# Patient Record
Sex: Male | Born: 1945 | Race: White | Hispanic: No | Marital: Married | Smoking: Former smoker
Health system: Southern US, Community
[De-identification: ages and names within clinical notes are randomized; demographics above are authoritative.]

---

## 2016-04-28 ENCOUNTER — Encounter (HOSPITAL_BASED_OUTPATIENT_CLINIC_OR_DEPARTMENT_OTHER): Payer: Medicare Other | Attending: Internal Medicine

## 2016-04-28 ENCOUNTER — Other Ambulatory Visit: Payer: Self-pay | Admitting: Internal Medicine

## 2016-04-28 ENCOUNTER — Ambulatory Visit (HOSPITAL_COMMUNITY)
Admission: RE | Admit: 2016-04-28 | Discharge: 2016-04-28 | Disposition: A | Discharge status: Home / Self Care | Payer: Medicare Other | Source: Ambulatory Visit | Attending: Internal Medicine | Admitting: Internal Medicine

## 2016-04-28 DIAGNOSIS — J449 Chronic obstructive pulmonary disease, unspecified: Secondary | ICD-10-CM | POA: Insufficient documentation

## 2016-04-28 DIAGNOSIS — M272 Inflammatory conditions of jaws: Secondary | ICD-10-CM | POA: Insufficient documentation

## 2016-04-28 DIAGNOSIS — Z9221 Personal history of antineoplastic chemotherapy: Secondary | ICD-10-CM | POA: Diagnosis not present

## 2016-04-28 DIAGNOSIS — I700 Atherosclerosis of aorta: Secondary | ICD-10-CM | POA: Diagnosis not present

## 2016-04-28 DIAGNOSIS — Z85828 Personal history of other malignant neoplasm of skin: Secondary | ICD-10-CM | POA: Insufficient documentation

## 2016-04-28 DIAGNOSIS — Z9889 Other specified postprocedural states: Secondary | ICD-10-CM | POA: Diagnosis not present

## 2016-04-28 DIAGNOSIS — Z9289 Personal history of other medical treatment: Secondary | ICD-10-CM

## 2016-04-28 DIAGNOSIS — Z87891 Personal history of nicotine dependence: Secondary | ICD-10-CM | POA: Insufficient documentation

## 2016-04-28 DIAGNOSIS — R0602 Shortness of breath: Secondary | ICD-10-CM | POA: Diagnosis present

## 2016-04-28 DIAGNOSIS — L598 Other specified disorders of the skin and subcutaneous tissue related to radiation: Secondary | ICD-10-CM | POA: Insufficient documentation

## 2016-04-28 DIAGNOSIS — Z923 Personal history of irradiation: Secondary | ICD-10-CM | POA: Insufficient documentation

## 2016-05-05 DIAGNOSIS — M272 Inflammatory conditions of jaws: Secondary | ICD-10-CM | POA: Diagnosis not present

## 2016-05-08 DIAGNOSIS — M272 Inflammatory conditions of jaws: Secondary | ICD-10-CM | POA: Diagnosis not present

## 2016-05-09 DIAGNOSIS — M272 Inflammatory conditions of jaws: Secondary | ICD-10-CM | POA: Diagnosis not present

## 2016-05-10 DIAGNOSIS — M272 Inflammatory conditions of jaws: Secondary | ICD-10-CM | POA: Diagnosis not present

## 2016-05-11 DIAGNOSIS — M272 Inflammatory conditions of jaws: Secondary | ICD-10-CM | POA: Diagnosis not present

## 2016-05-12 DIAGNOSIS — M272 Inflammatory conditions of jaws: Secondary | ICD-10-CM | POA: Diagnosis not present

## 2016-05-16 DIAGNOSIS — M272 Inflammatory conditions of jaws: Secondary | ICD-10-CM | POA: Diagnosis not present

## 2016-05-17 ENCOUNTER — Ambulatory Visit (INDEPENDENT_AMBULATORY_CARE_PROVIDER_SITE_OTHER): Payer: Medicare Other | Admitting: Internal Medicine

## 2016-05-17 ENCOUNTER — Encounter: Payer: Self-pay | Admitting: Internal Medicine

## 2016-05-17 VITALS — BP 166/76 | HR 64 | Temp 97.7°F | Ht 70.0 in | Wt 184.0 lb

## 2016-05-17 DIAGNOSIS — M272 Inflammatory conditions of jaws: Secondary | ICD-10-CM

## 2016-05-17 LAB — BASIC METABOLIC PANEL
BUN: 18 mg/dL (ref 7–25)
CHLORIDE: 102 mmol/L (ref 98–110)
CO2: 27 mmol/L (ref 20–31)
Calcium: 9.7 mg/dL (ref 8.6–10.3)
Creat: 1 mg/dL (ref 0.70–1.25)
Glucose, Bld: 192 mg/dL — ABNORMAL HIGH (ref 65–99)
POTASSIUM: 4 mmol/L (ref 3.5–5.3)
Sodium: 140 mmol/L (ref 135–146)

## 2016-05-17 LAB — CBC WITH DIFFERENTIAL/PLATELET
BASOS ABS: 0 {cells}/uL (ref 0–200)
BASOS PCT: 0 %
EOS ABS: 390 {cells}/uL (ref 15–500)
Eosinophils Relative: 6 %
HEMATOCRIT: 47.8 % (ref 38.5–50.0)
HEMOGLOBIN: 16.5 g/dL (ref 13.2–17.1)
Lymphocytes Relative: 16 %
Lymphs Abs: 1040 cells/uL (ref 850–3900)
MCH: 32.9 pg (ref 27.0–33.0)
MCHC: 34.5 g/dL (ref 32.0–36.0)
MCV: 95.2 fL (ref 80.0–100.0)
MONO ABS: 455 {cells}/uL (ref 200–950)
MONOS PCT: 7 %
MPV: 10.2 fL (ref 7.5–12.5)
NEUTROS ABS: 4615 {cells}/uL (ref 1500–7800)
Neutrophils Relative %: 71 %
Platelets: 169 10*3/uL (ref 140–400)
RBC: 5.02 MIL/uL (ref 4.20–5.80)
RDW: 13 % (ref 11.0–15.0)
WBC: 6.5 10*3/uL (ref 3.8–10.8)

## 2016-05-17 NOTE — Patient Instructions (Signed)
Please call us after you have procedure to check that we follow up on your culture results

## 2016-05-17 NOTE — Progress Notes (Signed)
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 The patient describes his symptoms as starting in April of 2017 as a dull 3/10 pain to his right jaw. He was evaluated by his dentist who attempted tooth extraction, however during RCT it was noted that there was a small area of exposed bone distal of the tooth #31, concern for osteomyelitis. The patient has intermittent swelling of the right mandible. The patient has undergone multiple rounds of antibiotics most recently including Augmentin (875/125) for the past 30 days. Each time the patient reports that his swelling and pain has improved with antibiotics but recurs once he is off of antibiotics. 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 He is planned to have further bone biopsy and possible dental extraction to affected teeth with the plan to start back on abtx as well as undergoing HBO in helps to improve his healing of jaw.   Outpatient Encounter Prescriptions as of 05/17/2016  Medication Sig  . albuterol (PROVENTIL HFA;VENTOLIN HFA) 108 (90 Base) MCG/ACT inhaler Inhale into the lungs.  Marland Kitchen. amoxicillin (AMOXIL) 875 MG tablet   . atorvastatin (LIPITOR) 20 MG tablet TAKE ONE TABLET (20 MG TOTAL) BY MOUTH DAILY.  . celecoxib (CELEBREX) 200 MG capsule **GREENSTONE BRAND**TAKE 1 CAPSULE BY MOUTH DAILY  . cetirizine (ZYRTEC) 10 MG tablet Take by mouth.  . fluticasone (FLONASE) 50 MCG/ACT nasal spray USE 2 SPRAYS IN EACH NOSTRIL EVERY DAY AS NEEDED.  Marland Kitchen. levothyroxine (SYNTHROID, LEVOTHROID) 75 MCG tablet TAKE 1 TABLET BY MOUTH EVERY DAY  . Multiple Vitamins-Minerals (MULTIVITAMIN WITH MINERALS) tablet Take by mouth.  Marland Kitchen. omeprazole (PRILOSEC) 20 MG capsule TAKE 1 CAPSULE BY  MOUTH DAILY *MAX 30 DAY ON INS*  . pilocarpine (SALAGEN) 5 MG tablet TAKE ONE TABLET (5 MG TOTAL) BY MOUTH DAILY.  . [DISCONTINUED] fluticasone furoate-vilanterol (BREO ELLIPTA) 100-25 MCG/INH AEPB Inhale into the lungs.   No facility-administered encounter medications on file as of 05/17/2016.      There are no active problems to display for this patient.    Health Maintenance Due  Topic Date Due  . Hepatitis C Screening  01949/03/09  . TETANUS/TDAP  08/12/1966  . COLONOSCOPY  08/11/1997  . ZOSTAVAX  08/12/2007  . PNA vac Low Risk Adult (1 of 2 - PCV13) 08/11/2012  . INFLUENZA VACCINE  12/28/2015    Social History  Substance Use Topics  . Smoking status: Former Smoker    Types: Cigarettes    Quit date: 05/29/2001  . Smokeless tobacco: Never Used  . Alcohol use 7.2 oz/week    8 Cans of beer, 4 Shots of liquor per week     Comment: beer and whiskey    Family hx: cancer+  Review of Systems + dry mouth side effect of radiation. Otherwise 10 point ros is negative Physical Exam   BP (!) 166/76   Pulse 64   Temp 97.7 F (36.5 C) (Oral)   Ht 5\' 10"  (1.778 m)   Wt 184 lb (83.5 kg)   BMI 26.40 kg/m   Physical Exam  Constitutional: He is oriented to person, place, and time. He appears well-developed and well-nourished. No distress.  HENT:  Mouth/Throat: Oropharynx is clear and moist.  No oropharyngeal exudate.  Cardiovascular: Normal rate, regular rhythm and normal heart sounds. Exam reveals no gallop and no friction rub.  No murmur heard.  Pulmonary/Chest: Effort normal and breath sounds normal. No respiratory distress. He has no wheezes.  Abdominal: Soft. Bowel sounds are normal. He exhibits no distension. There is no tenderness.  Lymphadenopathy:  He has no cervical adenopathy.  Neurological: He is alert and oriented to person, place, and time.  Skin: Skin is warm and dry. No rash noted. No erythema.  Psychiatric: He has a normal mood and affect. His behavior is normal.     Lab Results  Component Value Date   ESRSEDRATE 4 06/27/2016   Lab Results  Component Value Date   CRP 1.0 06/27/2016   CBC    Component Value Date/Time   WBC 6.5 05/17/2016 1654   RBC 5.02 05/17/2016 1654   HGB 16.5 05/17/2016 1654   HCT 47.8 05/17/2016 1654   PLT 169 05/17/2016 1654   MCV 95.2 05/17/2016 1654   MCH 32.9 05/17/2016 1654   MCHC 34.5 05/17/2016 1654   RDW 13.0 05/17/2016 1654   LYMPHSABS 1,040 05/17/2016 1654   MONOABS 455 05/17/2016 1654   EOSABS 390 05/17/2016 1654   BASOSABS 0 05/17/2016 1654   BMET    Component Value Date/Time   NA 140 05/17/2016 1654   K 4.0 05/17/2016 1654   CL 102 05/17/2016 1654   CO2 27 05/17/2016 1654   GLUCOSE 192 (H) 05/17/2016 1654   BUN 18 05/17/2016 1654   CREATININE 1.00 05/17/2016 1654   CALCIUM 9.7 05/17/2016 1654     Assessment and Plan  Jaw osteo = Currently on amoxicillin which he will continue until 1 wk prior to his oral surgery. Bone cx to be taken at that time  We will plan for prolonged abtx based upon cx results. He may need IV pending results  Will get labs today as baseline

## 2016-05-18 DIAGNOSIS — M272 Inflammatory conditions of jaws: Secondary | ICD-10-CM | POA: Diagnosis not present

## 2016-05-18 LAB — SEDIMENTATION RATE: Sed Rate: 1 mm/hr (ref 0–20)

## 2016-05-18 LAB — C-REACTIVE PROTEIN: CRP: 1.5 mg/L (ref <8.0)

## 2016-05-19 DIAGNOSIS — M272 Inflammatory conditions of jaws: Secondary | ICD-10-CM | POA: Diagnosis not present

## 2016-05-23 DIAGNOSIS — M272 Inflammatory conditions of jaws: Secondary | ICD-10-CM | POA: Diagnosis not present

## 2016-05-24 DIAGNOSIS — M272 Inflammatory conditions of jaws: Secondary | ICD-10-CM | POA: Diagnosis not present

## 2016-05-25 DIAGNOSIS — M272 Inflammatory conditions of jaws: Secondary | ICD-10-CM | POA: Diagnosis not present

## 2016-05-26 DIAGNOSIS — M272 Inflammatory conditions of jaws: Secondary | ICD-10-CM | POA: Diagnosis not present

## 2016-05-30 ENCOUNTER — Encounter (HOSPITAL_BASED_OUTPATIENT_CLINIC_OR_DEPARTMENT_OTHER): Payer: Medicare Other | Attending: Surgery

## 2016-05-30 DIAGNOSIS — M272 Inflammatory conditions of jaws: Secondary | ICD-10-CM | POA: Diagnosis present

## 2016-05-31 DIAGNOSIS — M272 Inflammatory conditions of jaws: Secondary | ICD-10-CM | POA: Diagnosis not present

## 2016-06-01 DIAGNOSIS — M272 Inflammatory conditions of jaws: Secondary | ICD-10-CM | POA: Diagnosis not present

## 2016-06-02 DIAGNOSIS — M272 Inflammatory conditions of jaws: Secondary | ICD-10-CM | POA: Diagnosis not present

## 2016-06-05 DIAGNOSIS — M272 Inflammatory conditions of jaws: Secondary | ICD-10-CM | POA: Diagnosis not present

## 2016-06-06 DIAGNOSIS — M272 Inflammatory conditions of jaws: Secondary | ICD-10-CM | POA: Diagnosis not present

## 2016-06-07 DIAGNOSIS — M272 Inflammatory conditions of jaws: Secondary | ICD-10-CM | POA: Diagnosis not present

## 2016-06-12 DIAGNOSIS — M272 Inflammatory conditions of jaws: Secondary | ICD-10-CM | POA: Diagnosis not present

## 2016-06-13 DIAGNOSIS — M272 Inflammatory conditions of jaws: Secondary | ICD-10-CM | POA: Diagnosis not present

## 2016-06-19 DIAGNOSIS — M272 Inflammatory conditions of jaws: Secondary | ICD-10-CM | POA: Diagnosis not present

## 2016-06-20 DIAGNOSIS — M272 Inflammatory conditions of jaws: Secondary | ICD-10-CM | POA: Diagnosis not present

## 2016-06-21 DIAGNOSIS — M272 Inflammatory conditions of jaws: Secondary | ICD-10-CM | POA: Diagnosis not present

## 2016-06-22 DIAGNOSIS — M272 Inflammatory conditions of jaws: Secondary | ICD-10-CM | POA: Diagnosis not present

## 2016-06-23 DIAGNOSIS — M272 Inflammatory conditions of jaws: Secondary | ICD-10-CM | POA: Diagnosis not present

## 2016-06-26 DIAGNOSIS — M272 Inflammatory conditions of jaws: Secondary | ICD-10-CM | POA: Diagnosis not present

## 2016-06-27 ENCOUNTER — Ambulatory Visit (INDEPENDENT_AMBULATORY_CARE_PROVIDER_SITE_OTHER): Payer: Medicare Other | Admitting: Internal Medicine

## 2016-06-27 VITALS — BP 128/77 | HR 71 | Temp 98.6°F | Wt 183.0 lb

## 2016-06-27 DIAGNOSIS — M272 Inflammatory conditions of jaws: Secondary | ICD-10-CM

## 2016-06-27 MED ORDER — AMOXICILLIN-POT CLAVULANATE 875-125 MG PO TABS: 1.0000 | ORAL_TABLET | Freq: Two times a day (BID) | ORAL | 1 refills | Status: DC

## 2016-06-27 NOTE — Progress Notes (Signed)
Patient ID: Nathaniel Olson, male   DOB: [birth year], 71 y.o.   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  Inflammatory markers were normal at last visit on 12/20. on augmentin. Briefly, he has  a history of radiation therapy to the neck with cisplatin chemotherapy X 2. The patient describes his symptoms as starting in April of 2017 as a dull 3/10 pain. He was evaluated by his dentist who attempted RCT on tooth #31, however during RCT it was noted that there was a small area of exposed bone distal of the tooth #31. The patient has intermittent swelling of the right mandible. The patient has undergone multiple rounds of antibiotics most recently including Augmentin (875/125) for the past 30 days. Each time the patient reports that his swelling and pain has improved with antibiotics. He is now undergoing HBO in helps to improve his healing of jaw.   On 1/11 had bone biopsy and debridement with cultures growing Mixed flora gram positives/ anaerobes 4+. He was restarted on amox/clav after surgery.  Osteoradionecrosis.   Gross Description A: "Bone and soft tissue of right mandible" and consists of a 1.2 x 0.7 x 0.3 cm fragment of soft pink-tan tissue in A1.Also received is a 1.8 x 0.5 x 0.3 cm tan cortical bone fragment submitted in A2, NTR following decalcification.  B: "Teeth" and it consists of a 3.6 x 2.3 x 0.6 cm aggregate of yellow-gray fragmented teeth and a 1.0 x 0.6 x 0.3 cm aggregate of soft red-brown tissue.Within the aggregate of teeth are two silver metal crowns up to 1.0 cm in greatest dimension.The soft tissue is submitted in B1.The teeth is retained in formalin.    Outpatient Encounter Prescriptions as of 06/27/2016  Medication Sig  . albuterol (PROVENTIL HFA;VENTOLIN HFA) 108 (90 Base) MCG/ACT inhaler Inhale into the lungs.  Marland Kitchen atorvastatin (LIPITOR) 20 MG tablet TAKE ONE TABLET (20 MG TOTAL) BY MOUTH DAILY.  .  celecoxib (CELEBREX) 200 MG capsule **GREENSTONE BRAND**TAKE 1 CAPSULE BY MOUTH DAILY  . cetirizine (ZYRTEC) 10 MG tablet Take by mouth.  . fluticasone (FLONASE) 50 MCG/ACT nasal spray USE 2 SPRAYS IN EACH NOSTRIL EVERY DAY AS NEEDED.  Marland Kitchen levothyroxine (SYNTHROID, LEVOTHROID) 75 MCG tablet TAKE 1 TABLET BY MOUTH EVERY DAY  . Multiple Vitamins-Minerals (MULTIVITAMIN WITH MINERALS) tablet Take by mouth.  Marland Kitchen omeprazole (PRILOSEC) 20 MG capsule TAKE 1 CAPSULE BY MOUTH DAILY *MAX 30 DAY ON INS*  . pilocarpine (SALAGEN) 5 MG tablet TAKE ONE TABLET (5 MG TOTAL) BY MOUTH DAILY.  . [DISCONTINUED] amoxicillin (AMOXIL) 875 MG tablet    No facility-administered encounter medications on file as of 06/27/2016.      There are no active problems to display for this patient.    Health Maintenance Due  Topic Date Due  . Hepatitis C Screening  09/14/1945  . TETANUS/TDAP  08/12/1966  . COLONOSCOPY  08/11/1997  . ZOSTAVAX  08/12/2007  . PNA vac Low Risk Adult (1 of 2 - PCV13) 08/11/2012     Review of Systems  Physical Exam   BP 128/77   Pulse 71   Temp 98.6 F (37 C) (Oral)   Wt 183 lb (83 kg)   BMI 26.26 kg/m   Physical Exam  Constitutional: He is oriented to person, place, and time. He appears well-developed and well-nourished. No distress.  HENT: has sutures to upper and bottow jaw of affected area, still some associated  tissue swelling Mouth/Throat: Oropharynx is clear and moist. No oropharyngeal exudate.  Cardiovascular: Normal rate, regular rhythm and normal heart sounds. Exam reveals no gallop and no friction rub.  No murmur heard.  Pulmonary/Chest: Effort normal and breath sounds normal. No respiratory distress. He has no wheezes.  Lymphadenopathy:  He has no cervical adenopathy.    CBC Lab Results  Component Value Date   WBC 6.5 05/17/2016   RBC 5.02 05/17/2016   HGB 16.5 05/17/2016   HCT 47.8 05/17/2016   PLT 169 05/17/2016   MCV 95.2 05/17/2016   MCH 32.9 05/17/2016   MCHC  34.5 05/17/2016   RDW 13.0 05/17/2016   LYMPHSABS 1,040 05/17/2016   MONOABS 455 05/17/2016   EOSABS 390 05/17/2016    BMET Lab Results  Component Value Date   NA 140 05/17/2016   K 4.0 05/17/2016   CL 102 05/17/2016   CO2 27 05/17/2016   GLUCOSE 192 (H) 05/17/2016   BUN 18 05/17/2016   CREATININE 1.00 05/17/2016   CALCIUM 9.7 05/17/2016   Lab Results  Component Value Date   ESRSEDRATE 4 06/27/2016   Lab Results  Component Value Date   CRP 1.0 06/27/2016      Assessment and Plan  Jaw osteomyelitis/osteonecrosis = continue with amox/clav plan to treat for 4 wk to complete 6 wk. Will check his labs- sed rate and crp  Addendum - still within normal. Do not feel he needs Iv therapy or much more prolonged abtx

## 2016-06-28 DIAGNOSIS — M272 Inflammatory conditions of jaws: Secondary | ICD-10-CM | POA: Diagnosis not present

## 2016-06-28 LAB — SEDIMENTATION RATE: Sed Rate: 4 mm/hr (ref 0–20)

## 2016-06-28 LAB — C-REACTIVE PROTEIN: CRP: 1 mg/L (ref <8.0)

## 2016-06-29 ENCOUNTER — Encounter (HOSPITAL_BASED_OUTPATIENT_CLINIC_OR_DEPARTMENT_OTHER): Payer: Medicare Other | Attending: Internal Medicine

## 2016-06-29 DIAGNOSIS — M272 Inflammatory conditions of jaws: Secondary | ICD-10-CM | POA: Insufficient documentation

## 2016-06-29 DIAGNOSIS — L598 Other specified disorders of the skin and subcutaneous tissue related to radiation: Secondary | ICD-10-CM | POA: Diagnosis not present

## 2016-06-30 DIAGNOSIS — M272 Inflammatory conditions of jaws: Secondary | ICD-10-CM | POA: Diagnosis not present

## 2016-07-03 DIAGNOSIS — M272 Inflammatory conditions of jaws: Secondary | ICD-10-CM | POA: Diagnosis not present

## 2016-07-04 DIAGNOSIS — M272 Inflammatory conditions of jaws: Secondary | ICD-10-CM | POA: Diagnosis not present

## 2016-07-05 DIAGNOSIS — M272 Inflammatory conditions of jaws: Secondary | ICD-10-CM | POA: Diagnosis not present

## 2016-07-06 DIAGNOSIS — M272 Inflammatory conditions of jaws: Secondary | ICD-10-CM | POA: Diagnosis not present

## 2016-07-07 DIAGNOSIS — M272 Inflammatory conditions of jaws: Secondary | ICD-10-CM | POA: Diagnosis not present

## 2016-07-10 DIAGNOSIS — M272 Inflammatory conditions of jaws: Secondary | ICD-10-CM | POA: Diagnosis not present

## 2016-07-11 DIAGNOSIS — M272 Inflammatory conditions of jaws: Secondary | ICD-10-CM | POA: Diagnosis not present

## 2016-07-12 ENCOUNTER — Telehealth: Payer: Self-pay | Admitting: *Deleted

## 2016-07-12 NOTE — Telephone Encounter (Signed)
Patient called c/o a dime size knot on his jaw line that started yesterday and he feels is progressively worsening. It is painful to the touch. He is still on the amoxicillin. Advised patient will speak to Dr. Drue SecondSnider in the AM and give him a call back in the morning.

## 2016-07-13 ENCOUNTER — Other Ambulatory Visit: Payer: Medicare Other

## 2016-07-13 ENCOUNTER — Other Ambulatory Visit: Payer: Self-pay | Admitting: Internal Medicine

## 2016-07-13 DIAGNOSIS — R6884 Jaw pain: Secondary | ICD-10-CM

## 2016-07-13 MED ORDER — CLINDAMYCIN HCL 300 MG PO CAPS: 300.0000 mg | ORAL_CAPSULE | Freq: Three times a day (TID) | ORAL | 0 refills | Status: DC

## 2016-07-13 NOTE — Telephone Encounter (Signed)
Dr. Drue SecondSnider spoke to patient and he was scheduled to come in for lab work this afternoon.

## 2016-07-13 NOTE — Progress Notes (Signed)
Having jaw pain while on amox/clav. Will add clindamycin for one week. Check sed rate and crp

## 2016-07-13 NOTE — Telephone Encounter (Signed)
I spoke to patient and gave course of clindamycin and labs

## 2016-07-14 LAB — C-REACTIVE PROTEIN: CRP: 1.4 mg/L (ref <8.0)

## 2016-07-14 LAB — SEDIMENTATION RATE: SED RATE: 1 mm/h (ref 0–20)

## 2016-07-18 ENCOUNTER — Telehealth: Payer: Self-pay | Admitting: *Deleted

## 2016-07-18 NOTE — Telephone Encounter (Signed)
Patient called and advised he has recent labs and never got the results. He also advised that he is on oral antibiotics that have no refills and he will be out by Friday and wants to know if he should continue on the medication. Advised him will contact the doctor and give him a call once she answers.

## 2016-07-19 ENCOUNTER — Other Ambulatory Visit: Payer: Self-pay | Admitting: Internal Medicine

## 2016-07-19 MED ORDER — CLINDAMYCIN HCL 300 MG PO CAPS: 300.0000 mg | ORAL_CAPSULE | Freq: Three times a day (TID) | ORAL | 0 refills | Status: AC

## 2016-07-19 NOTE — Progress Notes (Signed)
Patient reports having improvement in symptoms after starting clindamycin. He states less jaw pain and swelling. We will give refill for 14 days. He is to see oral surgeons on 3/8 for evaluation

## 2016-07-19 NOTE — Telephone Encounter (Signed)
I have spoken to patient. Will refill clindamycin but d/c augmentin. He is aware of instructions.

## 2016-08-01 ENCOUNTER — Encounter: Payer: Self-pay | Admitting: Internal Medicine

## 2016-08-01 ENCOUNTER — Ambulatory Visit (INDEPENDENT_AMBULATORY_CARE_PROVIDER_SITE_OTHER): Payer: Medicare Other | Admitting: Internal Medicine

## 2016-08-01 VITALS — BP 145/82 | HR 63 | Temp 98.1°F | Ht 70.0 in | Wt 180.0 lb

## 2016-08-01 DIAGNOSIS — M272 Inflammatory conditions of jaws: Secondary | ICD-10-CM | POA: Diagnosis present

## 2016-08-01 NOTE — Progress Notes (Signed)
Patient ID: Nathaniel Olson, male   DOB: May 31, 1945, 71 y.o.   MRN: [id number]  HPI 71yo M who had radiation induced jaw necrosis who developed right sided jaw pain thought to be due to abscessed tooth with concern for osteomyelitis. He previously was on prolonged oral abtx, at beginning of the year, he had debridement and tooth extraction, where he was restarted on amox/clav. Roughly 3-4 wk ago he started to have right sided jaw pain, he was switched empirically to clindamycin x 2 courses ( 20 d total). He Feels better from clindamycin but has residual numbness at corner of mouth. He denied swelling of area but more c/w nerve pain.  Outpatient Encounter Prescriptions as of 08/01/2016  Medication Sig  . albuterol (PROVENTIL HFA;VENTOLIN HFA) 108 (90 Base) MCG/ACT inhaler Inhale into the lungs.  Marland Kitchen. atorvastatin (LIPITOR) 20 MG tablet TAKE ONE TABLET (20 MG TOTAL) BY MOUTH DAILY.  . celecoxib (CELEBREX) 200 MG capsule **GREENSTONE BRAND**TAKE 1 CAPSULE BY MOUTH DAILY  . cetirizine (ZYRTEC) 10 MG tablet Take by mouth.  . chlorhexidine (PERIDEX) 0.12 % solution   . clindamycin (CLEOCIN) 300 MG capsule Take 1 capsule (300 mg total) by mouth 3 (three) times daily.  . fluticasone (FLONASE) 50 MCG/ACT nasal spray USE 2 SPRAYS IN EACH NOSTRIL EVERY DAY AS NEEDED.  Marland Kitchen. levothyroxine (SYNTHROID, LEVOTHROID) 75 MCG tablet TAKE 1 TABLET BY MOUTH EVERY DAY  . Multiple Vitamins-Minerals (MULTIVITAMIN WITH MINERALS) tablet Take by mouth.  Marland Kitchen. omeprazole (PRILOSEC) 20 MG capsule TAKE 1 CAPSULE BY MOUTH DAILY *MAX 30 DAY ON INS*  . pilocarpine (SALAGEN) 5 MG tablet TAKE ONE TABLET (5 MG TOTAL) BY MOUTH DAILY.  . Stannous Fluoride (GEL-KAM) 0.4 % GEL daily.   Marland Kitchen. amoxicillin-clavulanate (AUGMENTIN) 875-125 MG tablet Take 1 tablet by mouth 2 (two) times daily. (Patient not taking: Reported on 08/01/2016)   No facility-administered encounter medications on file as of 08/01/2016.      There are no active problems  to display for this patient.    Health Maintenance Due  Topic Date Due  . Hepatitis C Screening  0January 02, 1947  . TETANUS/TDAP  08/12/1966  . COLONOSCOPY  08/11/1997  . PNA vac Low Risk Adult (1 of 2 - PCV13) 08/11/2012     Review of Systems Review of Systems  Constitutional: Negative for fever, chills, diaphoresis, activity change, appetite change, fatigue and unexpected weight change.  HENT: Negative for congestion, sore throat, rhinorrhea, sneezing, trouble swallowing and sinus pressure.  Eyes: Negative for photophobia and visual disturbance.  Respiratory: Negative for cough, chest tightness, shortness of breath, wheezing and stridor.  Cardiovascular: Negative for chest pain, palpitations and leg swelling.  Gastrointestinal: Negative for nausea, vomiting, abdominal pain, diarrhea, constipation, blood in stool, abdominal distention and anal bleeding.  Genitourinary: Negative for dysuria, hematuria, flank pain and difficulty urinating.  Musculoskeletal: Negative for myalgias, back pain, joint swelling, arthralgias and gait problem.  Skin: Negative for color change, pallor, rash and wound.  Neurological: Negative for dizziness, tremors, weakness and light-headedness.  Hematological: Negative for adenopathy. Does not bruise/bleed easily.  Psychiatric/Behavioral: Negative for behavioral problems, confusion, sleep disturbance, dysphoric mood, decreased concentration and agitation.    Physical Exam   BP (!) 145/82   Pulse 63   Temp 98.1 F (36.7 C) (Oral)   Ht 5\' 10"  (1.778 m)   Wt 180 lb (81.6 kg)   BMI 25.83 kg/m   Physical Exam  Constitutional: He is oriented to person, place, and time. He appears well-developed  and well-nourished. No distress.  HENT: tooth extraction areas still slow to heal Mouth/Throat: Oropharynx is clear and moist. No oropharyngeal exudate.  Cardiovascular: Normal rate, regular rhythm and normal heart sounds. Exam reveals no gallop and no friction rub.  No  murmur heard.  Pulmonary/Chest: Effort normal and breath sounds normal. No respiratory distress. He has no wheezes.  Lymphadenopathy:  He has no cervical adenopathy.  Skin: Skin is warm and dry. No rash noted. No erythema.  Psychiatric: He has a normal mood and affect. His behavior is normal.    CBC Lab Results  Component Value Date   WBC 6.5 05/17/2016   RBC 5.02 05/17/2016   HGB 16.5 05/17/2016   HCT 47.8 05/17/2016   PLT 169 05/17/2016   MCV 95.2 05/17/2016   MCH 32.9 05/17/2016   MCHC 34.5 05/17/2016   RDW 13.0 05/17/2016   LYMPHSABS 1,040 05/17/2016   MONOABS 455 05/17/2016   EOSABS 390 05/17/2016    BMET Lab Results  Component Value Date   NA 140 05/17/2016   K 4.0 05/17/2016   CL 102 05/17/2016   CO2 27 05/17/2016   GLUCOSE 192 (H) 05/17/2016   BUN 18 05/17/2016   CREATININE 1.00 05/17/2016   CALCIUM 9.7 05/17/2016    Lab Results  Component Value Date   ESRSEDRATE 1 07/13/2016   Lab Results  Component Value Date   CRP 1.4 07/13/2016     Assessment and Plan  Jaw osteo = flare appears improved with 2 course of clindamycin. Will plan on having him switch back to amox/clav to see if it continues to have improved symptoms

## 2016-08-30 ENCOUNTER — Other Ambulatory Visit: Payer: Self-pay | Admitting: Internal Medicine

## 2016-08-30 MED ORDER — AMOXICILLIN-POT CLAVULANATE 875-125 MG PO TABS: 1.0000 | ORAL_TABLET | Freq: Two times a day (BID) | ORAL | 1 refills | Status: AC

## 2016-08-30 NOTE — Progress Notes (Signed)
Patient called to state he ran out of amox/clav. Will refill since still has open wound from tooth extraction

## 2018-01-22 IMAGING — CR DG CHEST 2V
2 series · 2 of 2 positions shown · non-contrast
Comparison: None.

CLINICAL DATA: History of squamous cell carcinoma of throat.
Shortness of breath.

EXAM:
CHEST  2 VIEW

[w chest pa]
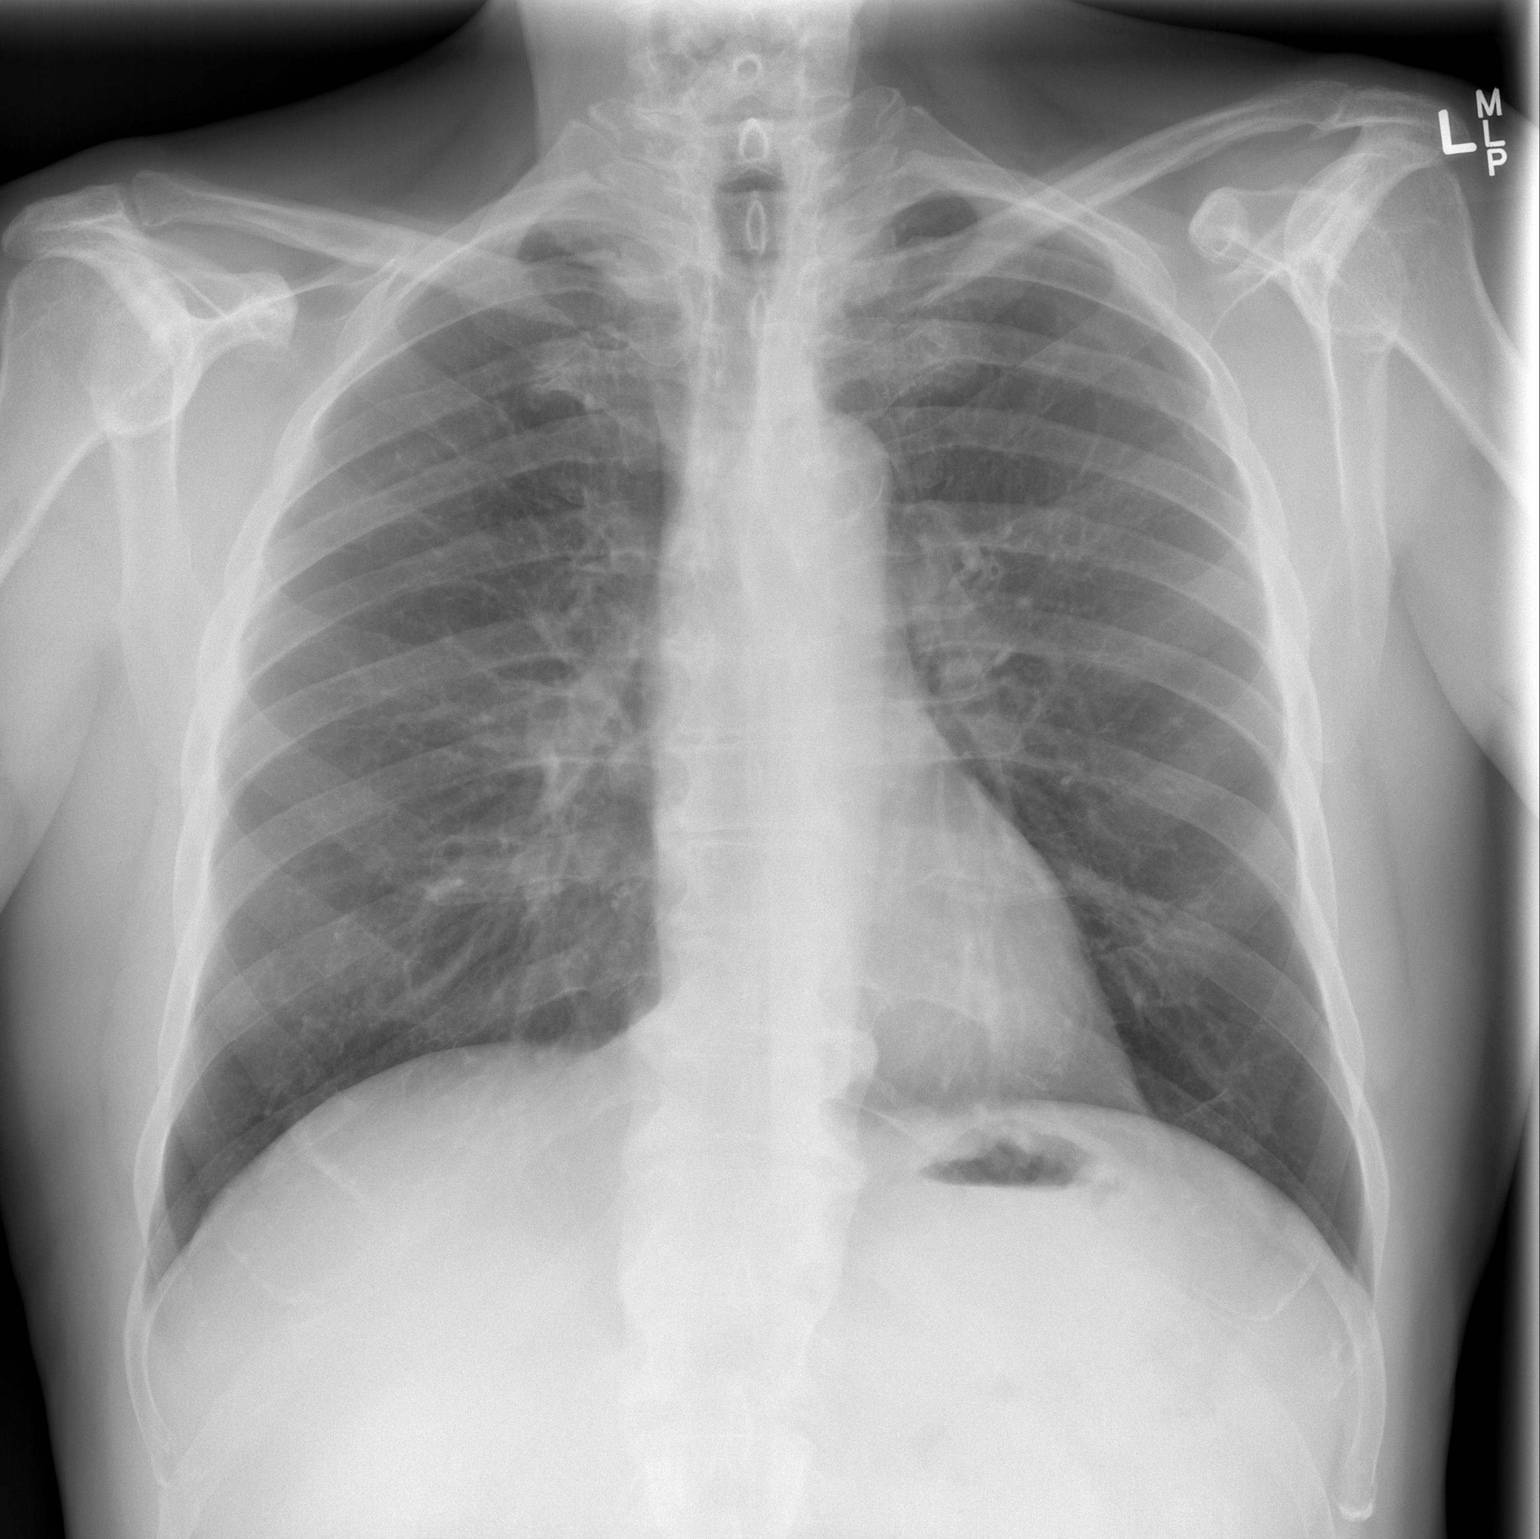

[w chest lat]
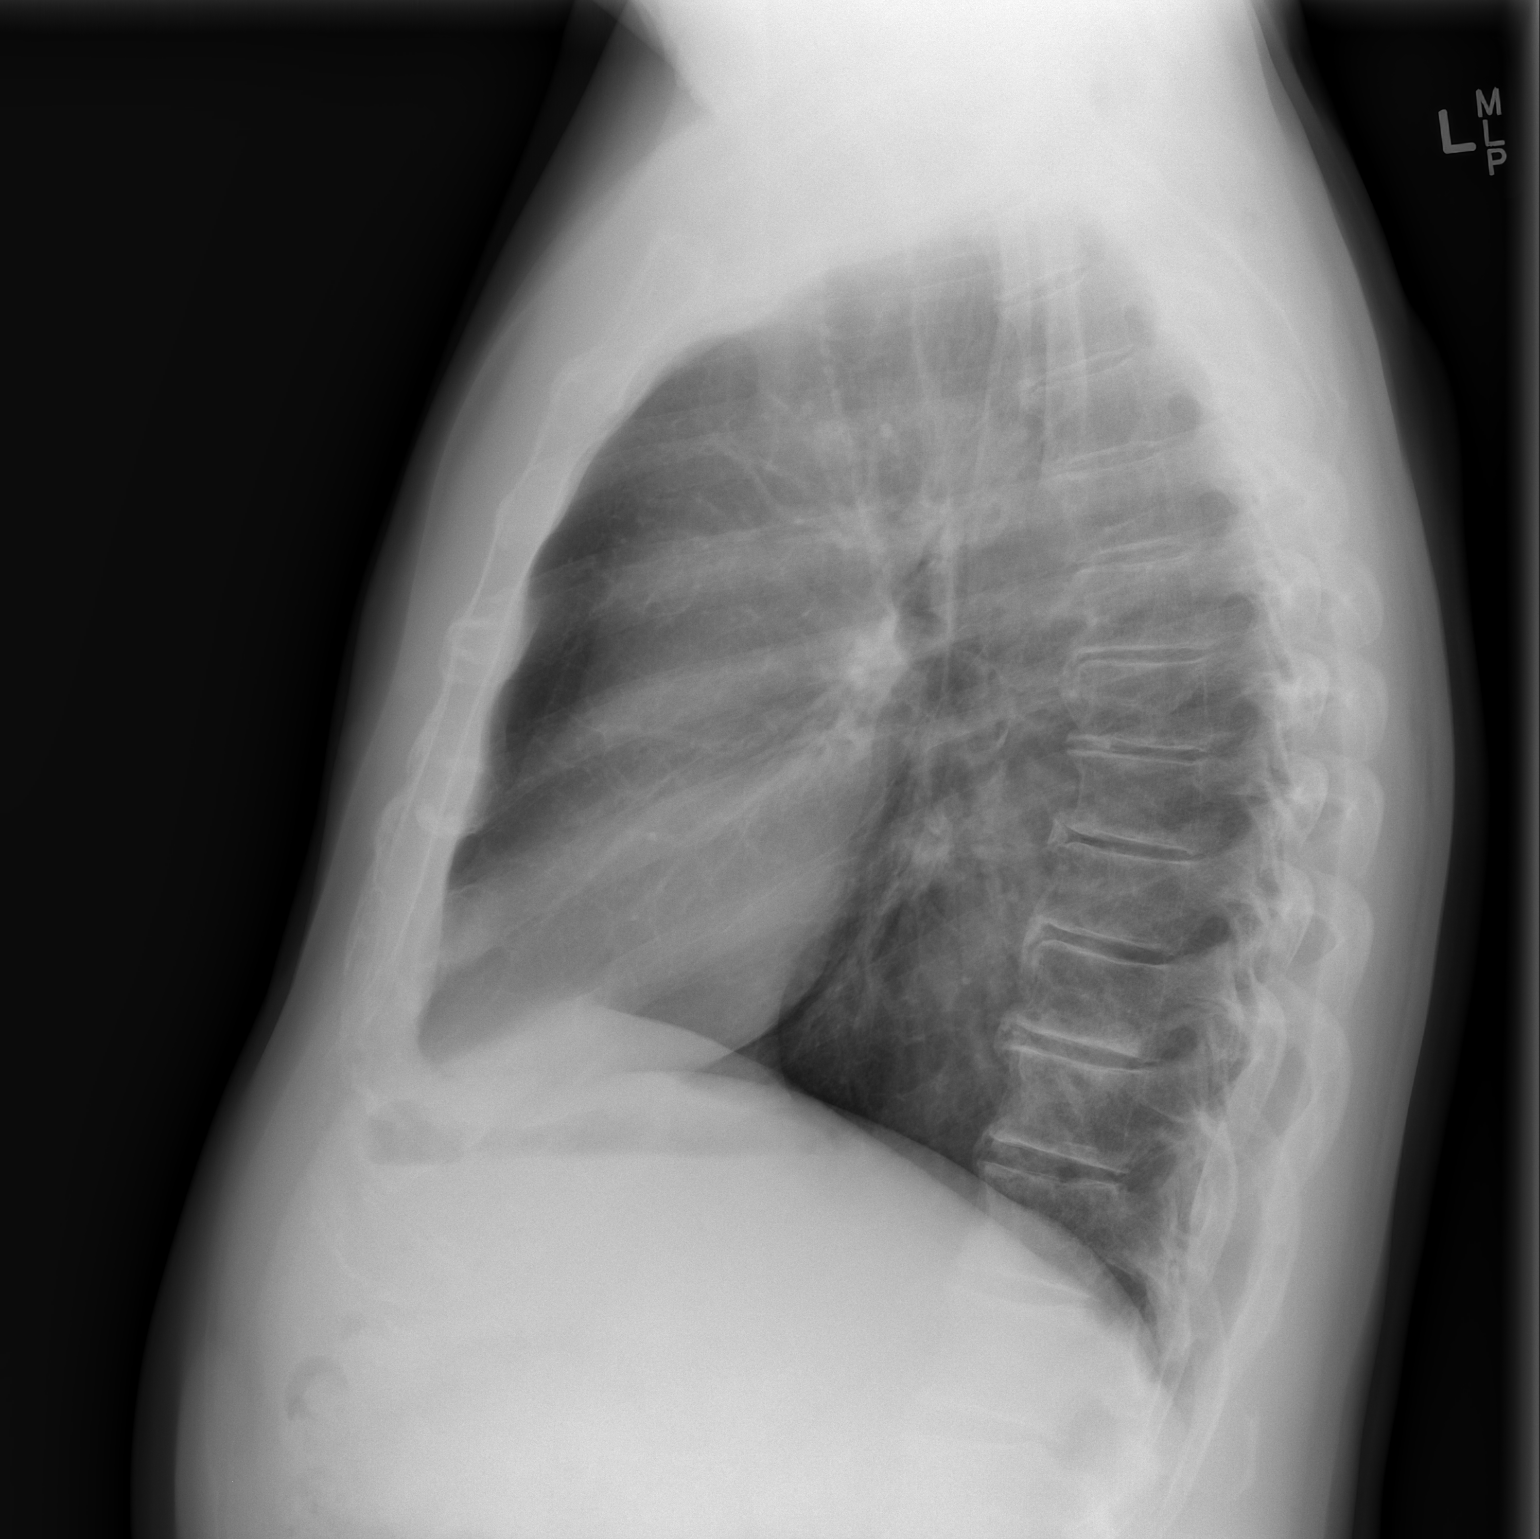

[2 of 2 positions shown; findings below may reference images not displayed]

FINDINGS: The heart size and mediastinal contours are within normal limits.
Both lungs are clear. No pneumothorax or pleural effusion is noted.
Atherosclerosis of thoracic aorta is noted. The visualized skeletal
structures are unremarkable.
IMPRESSION: No active cardiopulmonary disease.  Aortic atherosclerosis.
